# Patient Record
Sex: Male | Born: 2000 | Hispanic: No | Marital: Single | Smoking: Never smoker
Health system: Southern US, Community
[De-identification: ages and names within clinical notes are randomized; demographics above are authoritative.]

---
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
## 2020-01-02 DIAGNOSIS — Z419 Encounter for procedure for purposes other than remedying health state, unspecified: Secondary | ICD-10-CM | POA: Diagnosis not present

## 2020-02-01 DIAGNOSIS — Z419 Encounter for procedure for purposes other than remedying health state, unspecified: Secondary | ICD-10-CM | POA: Diagnosis not present

## 2020-03-03 DIAGNOSIS — Z419 Encounter for procedure for purposes other than remedying health state, unspecified: Secondary | ICD-10-CM | POA: Diagnosis not present

## 2020-04-03 DIAGNOSIS — Z419 Encounter for procedure for purposes other than remedying health state, unspecified: Secondary | ICD-10-CM | POA: Diagnosis not present

## 2020-05-01 DIAGNOSIS — Z419 Encounter for procedure for purposes other than remedying health state, unspecified: Secondary | ICD-10-CM | POA: Diagnosis not present

## 2020-06-01 DIAGNOSIS — Z419 Encounter for procedure for purposes other than remedying health state, unspecified: Secondary | ICD-10-CM | POA: Diagnosis not present

## 2020-07-01 DIAGNOSIS — Z419 Encounter for procedure for purposes other than remedying health state, unspecified: Secondary | ICD-10-CM | POA: Diagnosis not present

## 2020-07-18 DIAGNOSIS — Z7689 Persons encountering health services in other specified circumstances: Secondary | ICD-10-CM | POA: Diagnosis not present

## 2020-07-18 DIAGNOSIS — R430 Anosmia: Secondary | ICD-10-CM | POA: Diagnosis not present

## 2020-07-18 DIAGNOSIS — J309 Allergic rhinitis, unspecified: Secondary | ICD-10-CM | POA: Diagnosis not present

## 2020-07-18 DIAGNOSIS — E663 Overweight: Secondary | ICD-10-CM | POA: Diagnosis not present

## 2020-07-20 DIAGNOSIS — E663 Overweight: Secondary | ICD-10-CM | POA: Diagnosis not present

## 2020-08-01 DIAGNOSIS — Z419 Encounter for procedure for purposes other than remedying health state, unspecified: Secondary | ICD-10-CM | POA: Diagnosis not present

## 2020-08-31 DIAGNOSIS — Z419 Encounter for procedure for purposes other than remedying health state, unspecified: Secondary | ICD-10-CM | POA: Diagnosis not present

## 2020-10-01 DIAGNOSIS — Z419 Encounter for procedure for purposes other than remedying health state, unspecified: Secondary | ICD-10-CM | POA: Diagnosis not present

## 2020-11-01 DIAGNOSIS — Z419 Encounter for procedure for purposes other than remedying health state, unspecified: Secondary | ICD-10-CM | POA: Diagnosis not present

## 2020-12-01 DIAGNOSIS — Z419 Encounter for procedure for purposes other than remedying health state, unspecified: Secondary | ICD-10-CM | POA: Diagnosis not present

## 2021-01-01 DIAGNOSIS — Z419 Encounter for procedure for purposes other than remedying health state, unspecified: Secondary | ICD-10-CM | POA: Diagnosis not present

## 2021-01-31 DIAGNOSIS — Z419 Encounter for procedure for purposes other than remedying health state, unspecified: Secondary | ICD-10-CM | POA: Diagnosis not present

## 2021-03-03 DIAGNOSIS — Z419 Encounter for procedure for purposes other than remedying health state, unspecified: Secondary | ICD-10-CM | POA: Diagnosis not present

## 2021-03-05 ENCOUNTER — Ambulatory Visit (INDEPENDENT_AMBULATORY_CARE_PROVIDER_SITE_OTHER): Payer: Medicaid Other

## 2021-03-05 ENCOUNTER — Other Ambulatory Visit: Payer: Self-pay

## 2021-03-05 ENCOUNTER — Encounter (HOSPITAL_COMMUNITY): Payer: Self-pay | Admitting: *Deleted

## 2021-03-05 ENCOUNTER — Ambulatory Visit (HOSPITAL_COMMUNITY)
Admission: EM | Admit: 2021-03-05 | Discharge: 2021-03-05 | Disposition: A | Discharge status: Home / Self Care | Payer: Medicaid Other | Attending: Emergency Medicine | Admitting: Emergency Medicine

## 2021-03-05 DIAGNOSIS — S9781XA Crushing injury of right foot, initial encounter: Secondary | ICD-10-CM

## 2021-03-05 DIAGNOSIS — S9031XA Contusion of right foot, initial encounter: Secondary | ICD-10-CM

## 2021-03-05 MED ORDER — DICLOFENAC SODIUM 1 % EX GEL: 4.0000 g | Freq: Four times a day (QID) | CUTANEOUS | 0 refills | Status: AC

## 2021-03-05 NOTE — Discharge Instructions (Addendum)
-  Voltaren gel up to 4x daily -You can take Tylenol up to 1000 mg 3 times daily, and ibuprofen up to 600 mg 3 times daily with food.  You can take these together, or alternate every 3-4 hours. -Rest, ice -Shoe while pain persists- while walking and standing.

## 2021-03-05 NOTE — ED Provider Notes (Signed)
[facility]    CSN: [id number] Arrival date & time: 03/05/21  1800      History   Chief Complaint Chief Complaint  Patient presents with   Foot Injury   Back Pain    HPI Carlos Franklin is a 21 y.o. male presenting with right foot pain for 1 day following a wall/pallet falling on him that he was helping carry.  States the pallet fell on his foot and caused him to fall backwards onto his bottom.  Pain and bruising to the right toes.  Also with some tenderness over the right buttock.  Also with shoulder pain with movement.  Ambulating with pain.  Tylenol cannot provide relief.  Denies sensation changes.  Denies head trauma, LOC.  HPI  History reviewed. No pertinent past medical history.  Patient Active Problem List   Diagnosis Date Noted   Common wart 06/11/2012    History reviewed. No pertinent surgical history.     Home Medications    Prior to Admission medications   Medication Sig Start Date End Date Taking? Authorizing Provider  diclofenac Sodium (VOLTAREN) 1 % GEL Apply 4 g topically 4 (four) times daily. 03/05/21  Yes Hazel Sams, PA-C  dicyclomine (BENTYL) 20 MG tablet Take 1 tablet (20 mg total) by mouth 3 (three) times daily before meals. As needed for abdominal cramping 04/24/16   Ward, Delice Bison, DO    Family History History reviewed. No pertinent family history.  Social History Social History   Tobacco Use   Smoking status: Never   Smokeless tobacco: Never     Allergies   Patient has no known allergies.   Review of Systems Review of Systems  Musculoskeletal:        R foot pain  All other systems reviewed and are negative.   Physical Exam Triage Vital Signs ED Triage Vitals  Enc Vitals Group     BP 03/05/21 1937 123/71     Pulse --      Resp 03/05/21 1937 20     Temp 03/05/21 1937 (!) 84 F (28.9 C)     Temp src --      SpO2 03/05/21 1937 97 %     Weight --      Height --      Head Circumference --      Peak Flow --       Pain Score 03/05/21 1933 8     Pain Loc --      Pain Edu? --      Excl. in [place]? --    No data found.  Updated Vital Signs BP 123/71    Temp 98.4 F (36.9 C) (Oral)    Resp 20    SpO2 97%   Visual Acuity Right Eye Distance:   Left Eye Distance:   Bilateral Distance:    Right Eye Near:   Left Eye Near:    Bilateral Near:     Physical Exam Vitals reviewed.  Constitutional:      General: He is not in acute distress.    Appearance: Normal appearance. He is not ill-appearing.  HENT:     Head: Normocephalic and atraumatic.  Cardiovascular:     Rate and Rhythm: Normal rate and regular rhythm.     Heart sounds: Normal heart sounds.  Pulmonary:     Effort: Pulmonary effort is normal.     Breath sounds: Normal breath sounds and air entry.  Abdominal:     Tenderness: There is  no abdominal tenderness. There is no right CVA tenderness, left CVA tenderness, guarding or rebound.  Musculoskeletal:     Cervical back: Normal range of motion. No swelling, deformity, signs of trauma, rigidity, spasms, tenderness, bony tenderness or crepitus. No pain with movement.     Thoracic back: No swelling, deformity, signs of trauma, spasms, tenderness or bony tenderness. Normal range of motion. No scoliosis.     Lumbar back: No swelling, deformity, signs of trauma, spasms, tenderness or bony tenderness. Normal range of motion. Negative right straight leg raise test and negative left straight leg raise test. No scoliosis.     Comments: Bilateral proximal trapezius tenderness to palpation. No paraspinous tenderness. No midline spinous tenderness, deformity, stepoff.  Right foot-dorsal toes 2-4 with ecchymosis and tenderness to palpation.  No bony deformity.  No midfoot or malleolar tenderness.  Ambulating with pain.  DP 2+, cap refill less than 2 seconds.  Some tenderness to palpation right buttock without skin changes or bony tenderness.  Range of motion hip intact and without pain.  No hip or pelvic  instability  Absolutely no other injury, deformity, tenderness, ecchymosis, abrasion.  Neurological:     General: No focal deficit present.     Mental Status: He is alert.     Cranial Nerves: No cranial nerve deficit.  Psychiatric:        Mood and Affect: Mood normal.        Behavior: Behavior normal.        Thought Content: Thought content normal.        Judgment: Judgment normal.     UC Treatments / Results  Labs (all labs ordered are listed, but only abnormal results are displayed) Labs Reviewed - No data to display  EKG   Radiology DG Foot Complete Right  Result Date: 03/05/2021 CLINICAL DATA:  Crush injury third through fifth digits EXAM: RIGHT FOOT COMPLETE - 3+ VIEW COMPARISON:  None. FINDINGS: Frontal, oblique, and lateral views of the right foot are obtained. No acute fracture, subluxation, or dislocation. Joint spaces are well preserved. Soft tissues are normal. IMPRESSION: 1. Unremarkable right foot. Electronically Signed   By: Sharlet Salina M.D.   On: 03/05/2021 19:49    Procedures Procedures (including critical care time)  Medications Ordered in UC Medications - No data to display  Initial Impression / Assessment and Plan / UC Course  I have reviewed the triage vital signs and the nursing notes.  Pertinent labs & imaging results that were available during my care of the patient were reviewed by me and considered in my medical decision making (see chart for details).     This patient is a very pleasant 21 y.o. year old male presenting with R foot contusion.  Neurovascularly intact.   Xray R foot - negative.   Postop shoe.  Patient wishes to try Voltaren gel for pain relief.  Sent.  Also rec Tylenol/ibuprofen, RICE.  ED return precautions discussed. Patient verbalizes understanding and agreement.     Final Clinical Impressions(s) / UC Diagnoses   Final diagnoses:  Contusion of right foot, initial encounter     Discharge Instructions       -Voltaren gel up to 4x daily -You can take Tylenol up to 1000 mg 3 times daily, and ibuprofen up to 600 mg 3 times daily with food.  You can take these together, or alternate every 3-4 hours. -Rest, ice -Shoe while pain persists- while walking and standing.     ED Prescriptions  Medication Sig Dispense Auth. Provider   diclofenac Sodium (VOLTAREN) 1 % GEL Apply 4 g topically 4 (four) times daily. 100 g Hazel Sams, PA-C      PDMP not reviewed this encounter.   Hazel Sams, PA-C 03/05/21 2025

## 2021-03-05 NOTE — ED Triage Notes (Signed)
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

## 2021-04-03 DIAGNOSIS — Z419 Encounter for procedure for purposes other than remedying health state, unspecified: Secondary | ICD-10-CM | POA: Diagnosis not present

## 2021-05-01 DIAGNOSIS — Z419 Encounter for procedure for purposes other than remedying health state, unspecified: Secondary | ICD-10-CM | POA: Diagnosis not present

## 2021-06-01 DIAGNOSIS — Z419 Encounter for procedure for purposes other than remedying health state, unspecified: Secondary | ICD-10-CM | POA: Diagnosis not present

## 2021-07-01 DIAGNOSIS — Z419 Encounter for procedure for purposes other than remedying health state, unspecified: Secondary | ICD-10-CM | POA: Diagnosis not present

## 2021-08-01 DIAGNOSIS — Z419 Encounter for procedure for purposes other than remedying health state, unspecified: Secondary | ICD-10-CM | POA: Diagnosis not present

## 2021-08-31 DIAGNOSIS — Z419 Encounter for procedure for purposes other than remedying health state, unspecified: Secondary | ICD-10-CM | POA: Diagnosis not present

## 2021-10-01 DIAGNOSIS — Z419 Encounter for procedure for purposes other than remedying health state, unspecified: Secondary | ICD-10-CM | POA: Diagnosis not present

## 2021-11-01 DIAGNOSIS — Z419 Encounter for procedure for purposes other than remedying health state, unspecified: Secondary | ICD-10-CM | POA: Diagnosis not present

## 2021-12-10 ENCOUNTER — Emergency Department (HOSPITAL_COMMUNITY)
Admission: EM | Admit: 2021-12-10 | Discharge: 2021-12-10 | Disposition: A | Discharge status: Home / Self Care | Payer: Medicaid Other | Attending: Emergency Medicine | Admitting: Emergency Medicine

## 2021-12-10 ENCOUNTER — Encounter (HOSPITAL_COMMUNITY): Payer: Self-pay

## 2021-12-10 DIAGNOSIS — H73892 Other specified disorders of tympanic membrane, left ear: Secondary | ICD-10-CM | POA: Insufficient documentation

## 2021-12-10 DIAGNOSIS — H66001 Acute suppurative otitis media without spontaneous rupture of ear drum, right ear: Secondary | ICD-10-CM | POA: Insufficient documentation

## 2021-12-10 MED ORDER — IBUPROFEN 800 MG PO TABS: 800.0000 mg | ORAL_TABLET | Freq: Three times a day (TID) | ORAL | 0 refills | Status: AC

## 2021-12-10 MED ORDER — AMOXICILLIN 500 MG PO CAPS: 500.0000 mg | ORAL_CAPSULE | Freq: Three times a day (TID) | ORAL | 0 refills | Status: AC

## 2021-12-10 NOTE — Discharge Instructions (Signed)
You have a right-sided ear infection.  Your left ear does not appear infected however the antibiotic will cover it as well.  Ibuprofen has been sent for your pain and you may alternate this with Tylenol as well.  Please follow-up with the PCP in the future.  A couple of offices are attached to this discharge.

## 2021-12-10 NOTE — ED Triage Notes (Signed)
Pt arrived via POV, c/o right ear pain x3 wks, left ear with pain this morning. Has tried some OTC treatment with little relief.

## 2021-12-10 NOTE — ED Provider Notes (Signed)
[facility] DEPT Provider Note   CSN: [id number] Arrival date & time: 12/10/21  1421     History  Chief Complaint  Patient presents with   Otalgia    Carlos Franklin is a 21 y.o. male presenting with bilateral otalgia.  Has been going on for "a while" but this over the past 3 days it is acutely worsened.  No fevers or chills.  No decrease in hearing.  No drainage.   Otalgia Associated symptoms: no ear discharge and no hearing loss        Home Medications Prior to Admission medications   Medication Sig Start Date End Date Taking? Authorizing Provider  amoxicillin (AMOXIL) 500 MG capsule Take 1 capsule (500 mg total) by mouth 3 (three) times daily. 12/10/21  Yes Kenric Ginger A, PA-C  ibuprofen (ADVIL) 800 MG tablet Take 1 tablet (800 mg total) by mouth 3 (three) times daily. 12/10/21  Yes Harjot Dibello A, PA-C  diclofenac Sodium (VOLTAREN) 1 % GEL Apply 4 g topically 4 (four) times daily. 03/05/21   Hazel Sams, PA-C  dicyclomine (BENTYL) 20 MG tablet Take 1 tablet (20 mg total) by mouth 3 (three) times daily before meals. As needed for abdominal cramping 04/24/16   Ward, Delice Bison, DO      Allergies    Patient has no known allergies.    Review of Systems   Review of Systems  Constitutional:  Negative for chills.  HENT:  Positive for ear pain. Negative for ear discharge, facial swelling, hearing loss and sinus pain.     Physical Exam Updated Vital Signs BP 130/88 (BP Location: Left Arm)   Pulse 86   Temp 98.1 F (36.7 C) (Oral)   Resp 18   SpO2 100%  Physical Exam Vitals and nursing note reviewed.  Constitutional:      Appearance: Normal appearance.  HENT:     Head: Normocephalic and atraumatic.     Left Ear: Tympanic membrane and ear canal normal.     Ears:     Comments: Erythematous left ear canal without inflammation.  Right ear canal erythematous, inflamed and with a bulging TM. Eyes:     General: No scleral  icterus.    Conjunctiva/sclera: Conjunctivae normal.  Pulmonary:     Effort: Pulmonary effort is normal. No respiratory distress.  Skin:    Findings: No rash.  Neurological:     Mental Status: He is alert.  Psychiatric:        Mood and Affect: Mood normal.     ED Results / Procedures / Treatments   Labs (all labs ordered are listed, but only abnormal results are displayed) Labs Reviewed - No data to display  EKG None  Radiology No results found.  Procedures Procedures   Medications Ordered in ED Medications - No data to display  ED Course/ Medical Decision Making/ A&P                           Medical Decision Making Risk Prescription drug management.   21 year old male presenting with ear pain.  Physical exam consistent with otitis media on the right side, relatively benign left ear evaluation.  No suspicion for mastoiditis or labyrinthitis.  Because patient has been dealing with this for an extended period of time I will treat him with amoxicillin.  He needs a primary care provider who he can see for such complaints so I have listed some offices on his  discharge papers.  He is agreeable to discharge with these medications.Work note provided.  Final Clinical Impression(s) / ED Diagnoses Final diagnoses:  Non-recurrent acute suppurative otitis media of right ear without spontaneous rupture of tympanic membrane    Rx / DC Orders ED Discharge Orders          Ordered    amoxicillin (AMOXIL) 500 MG capsule  3 times daily        12/10/21 1442    ibuprofen (ADVIL) 800 MG tablet  3 times daily        12/10/21 1442           Results and diagnoses were explained to the patient. Return precautions discussed in full. Patient had no additional questions and expressed complete understanding.   This chart was dictated using voice recognition software.  Despite best efforts to proofread,  errors can occur which can change the documentation meaning.    Saddie Benders, PA-C 12/10/21 1446    Lonell Grandchild, MD 12/12/21 [phone]

## 2022-01-31 DIAGNOSIS — Z419 Encounter for procedure for purposes other than remedying health state, unspecified: Secondary | ICD-10-CM | POA: Diagnosis not present

## 2022-04-03 DIAGNOSIS — Z419 Encounter for procedure for purposes other than remedying health state, unspecified: Secondary | ICD-10-CM | POA: Diagnosis not present

## 2022-05-02 DIAGNOSIS — Z419 Encounter for procedure for purposes other than remedying health state, unspecified: Secondary | ICD-10-CM | POA: Diagnosis not present

## 2022-06-02 DIAGNOSIS — Z419 Encounter for procedure for purposes other than remedying health state, unspecified: Secondary | ICD-10-CM | POA: Diagnosis not present

## 2022-07-02 DIAGNOSIS — Z419 Encounter for procedure for purposes other than remedying health state, unspecified: Secondary | ICD-10-CM | POA: Diagnosis not present

## 2022-08-02 DIAGNOSIS — Z419 Encounter for procedure for purposes other than remedying health state, unspecified: Secondary | ICD-10-CM | POA: Diagnosis not present

## 2022-09-01 DIAGNOSIS — Z419 Encounter for procedure for purposes other than remedying health state, unspecified: Secondary | ICD-10-CM | POA: Diagnosis not present

## 2022-10-02 DIAGNOSIS — Z419 Encounter for procedure for purposes other than remedying health state, unspecified: Secondary | ICD-10-CM | POA: Diagnosis not present

## 2022-11-02 DIAGNOSIS — Z419 Encounter for procedure for purposes other than remedying health state, unspecified: Secondary | ICD-10-CM | POA: Diagnosis not present

## 2023-01-02 DIAGNOSIS — Z419 Encounter for procedure for purposes other than remedying health state, unspecified: Secondary | ICD-10-CM | POA: Diagnosis not present

## 2023-02-01 DIAGNOSIS — Z419 Encounter for procedure for purposes other than remedying health state, unspecified: Secondary | ICD-10-CM | POA: Diagnosis not present

## 2023-03-04 DIAGNOSIS — Z419 Encounter for procedure for purposes other than remedying health state, unspecified: Secondary | ICD-10-CM | POA: Diagnosis not present

## 2023-04-04 DIAGNOSIS — Z419 Encounter for procedure for purposes other than remedying health state, unspecified: Secondary | ICD-10-CM | POA: Diagnosis not present

## 2023-05-02 DIAGNOSIS — Z419 Encounter for procedure for purposes other than remedying health state, unspecified: Secondary | ICD-10-CM | POA: Diagnosis not present

## 2023-06-13 DIAGNOSIS — Z419 Encounter for procedure for purposes other than remedying health state, unspecified: Secondary | ICD-10-CM | POA: Diagnosis not present

## 2023-07-13 DIAGNOSIS — Z419 Encounter for procedure for purposes other than remedying health state, unspecified: Secondary | ICD-10-CM | POA: Diagnosis not present

## 2023-07-28 IMAGING — DX DG FOOT COMPLETE 3+V*R*
4 series · 4 of 4 positions shown · non-contrast
Comparison: None.

CLINICAL DATA: Crush injury third through fifth digits

EXAM:
RIGHT FOOT COMPLETE - 3+ VIEW

[foot ap]
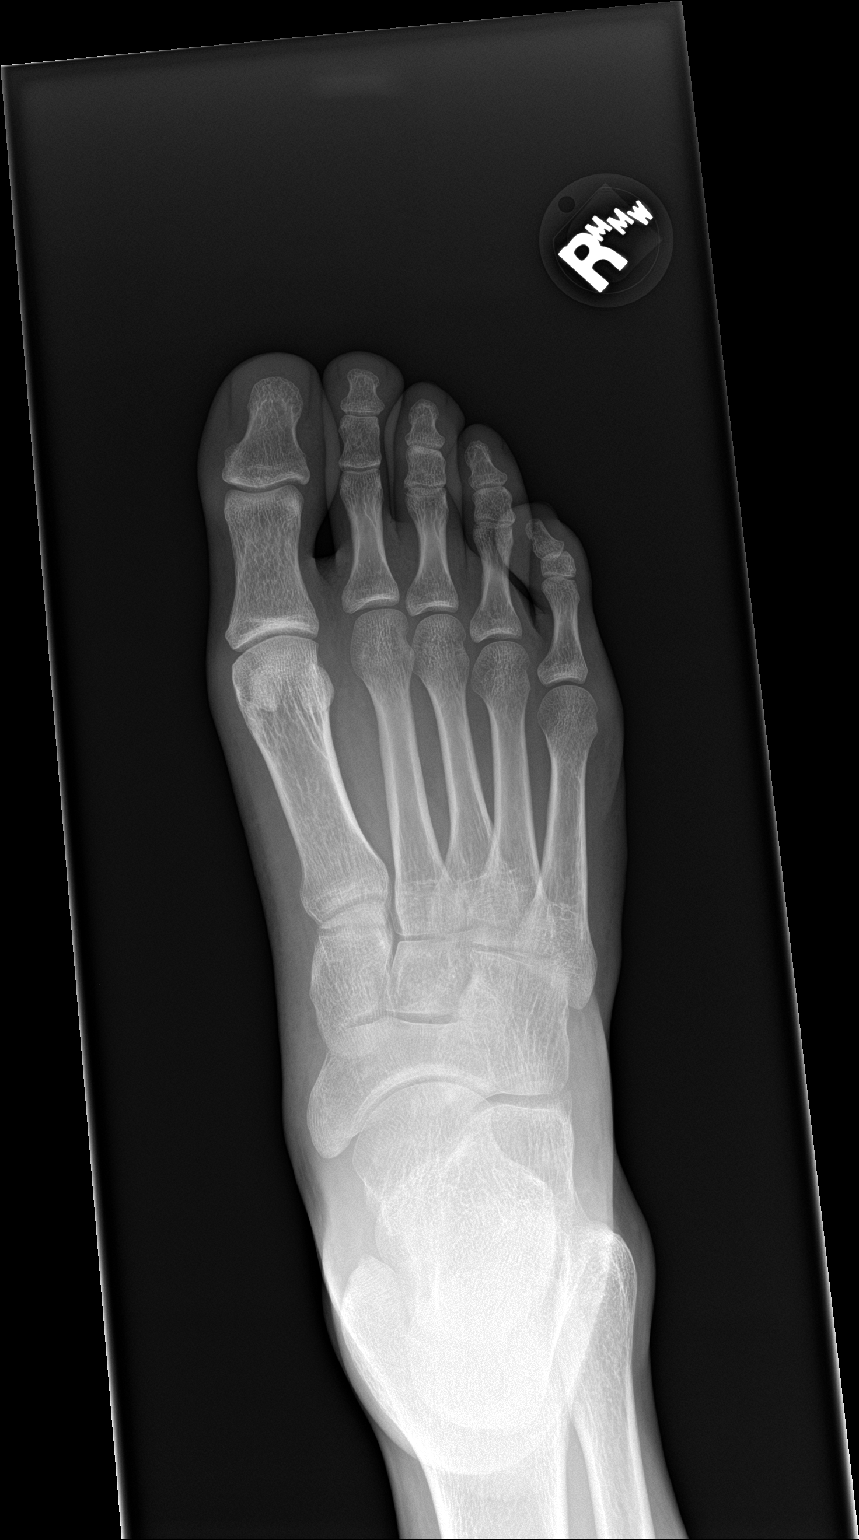

[foot obl]
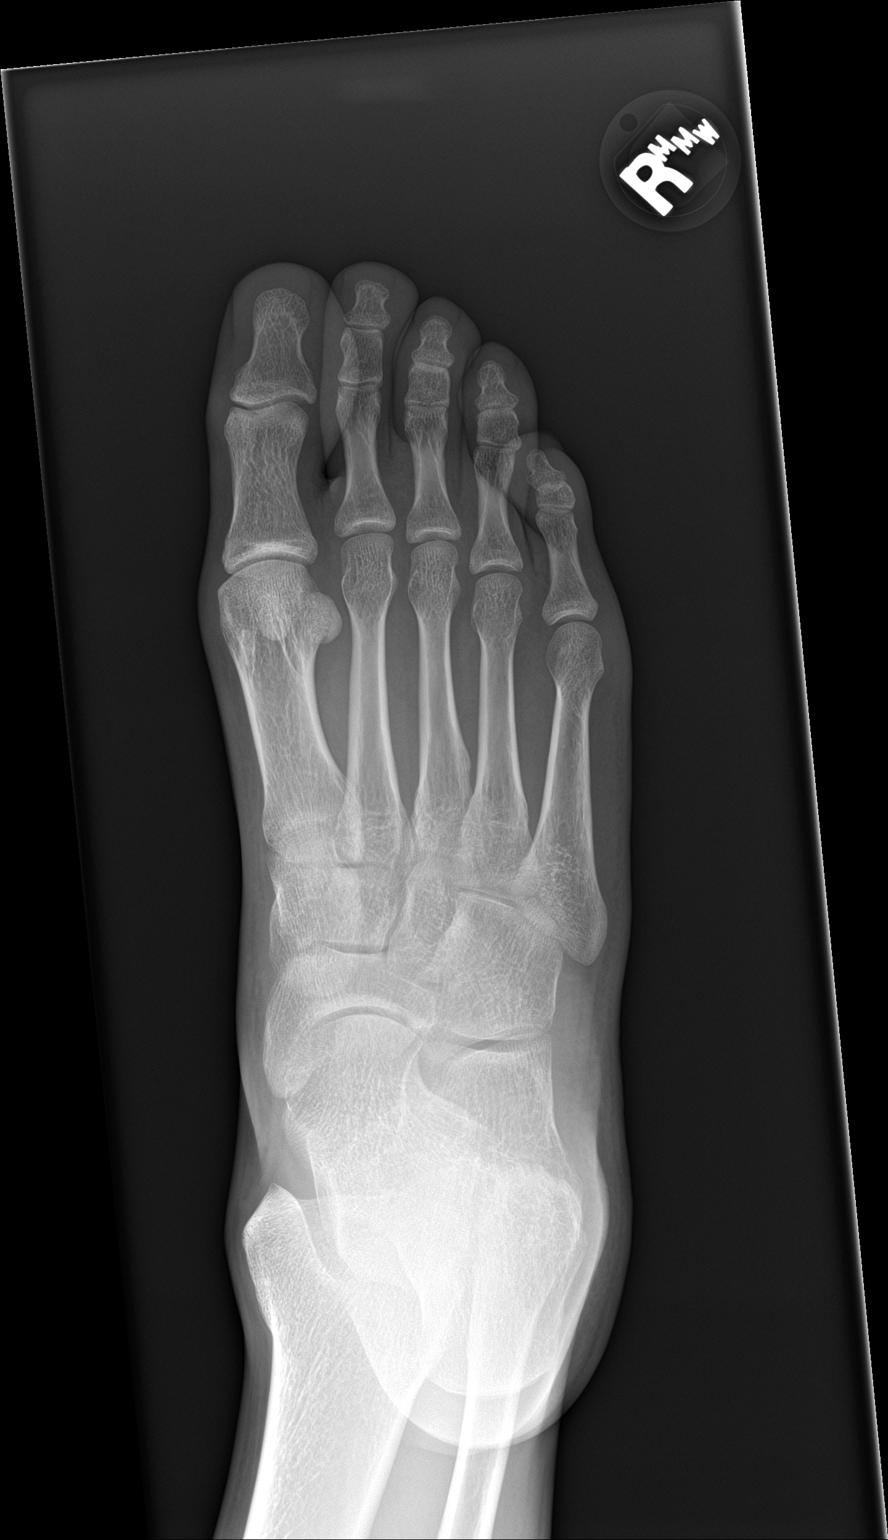

[foot lat (1 of 2)]
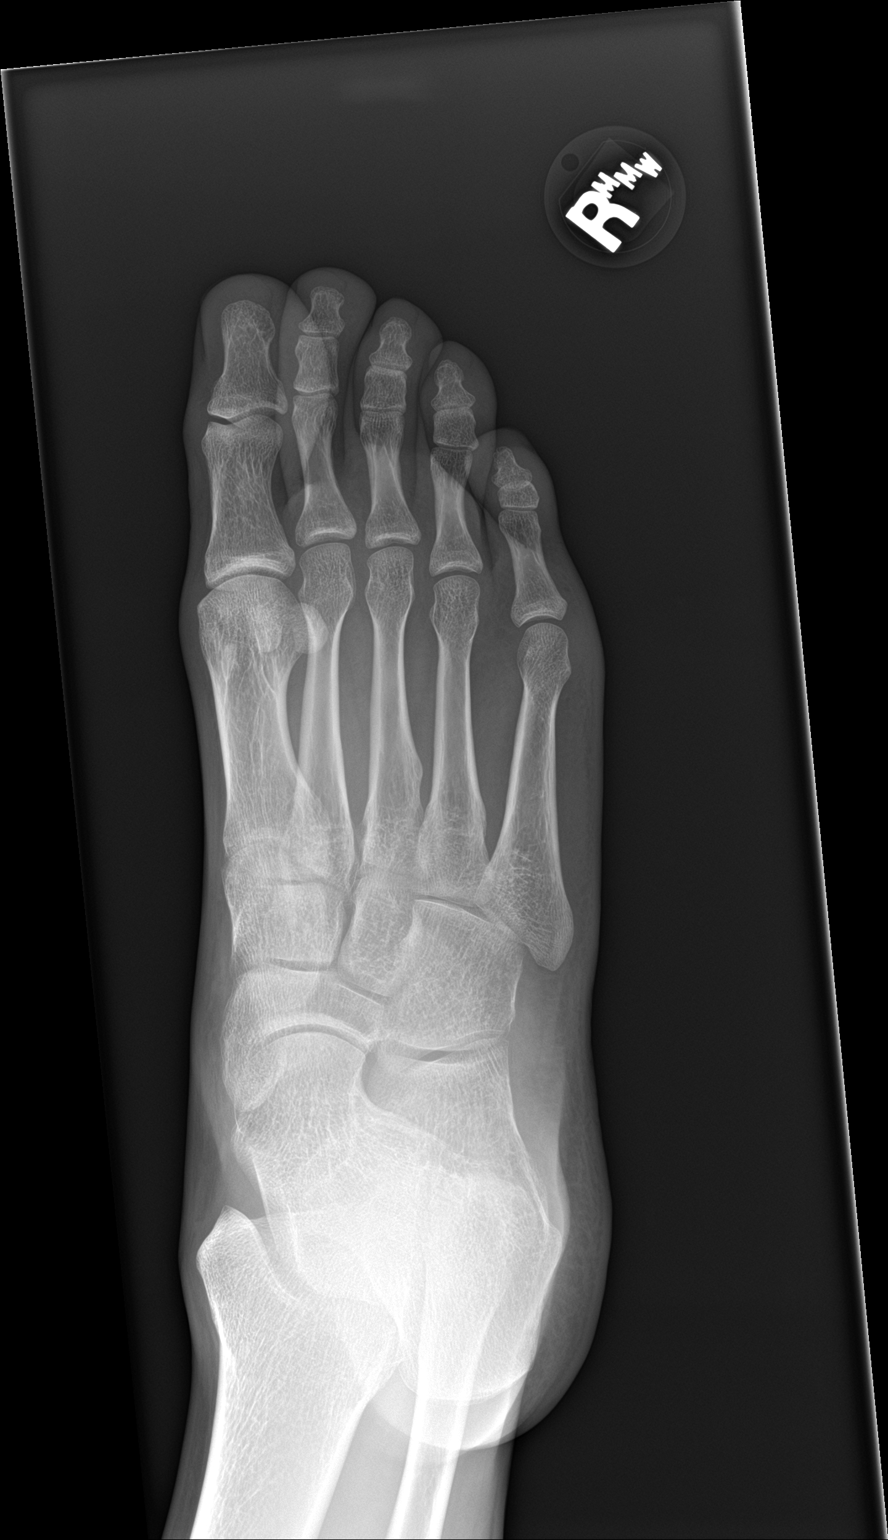

[foot lat (2 of 2)]
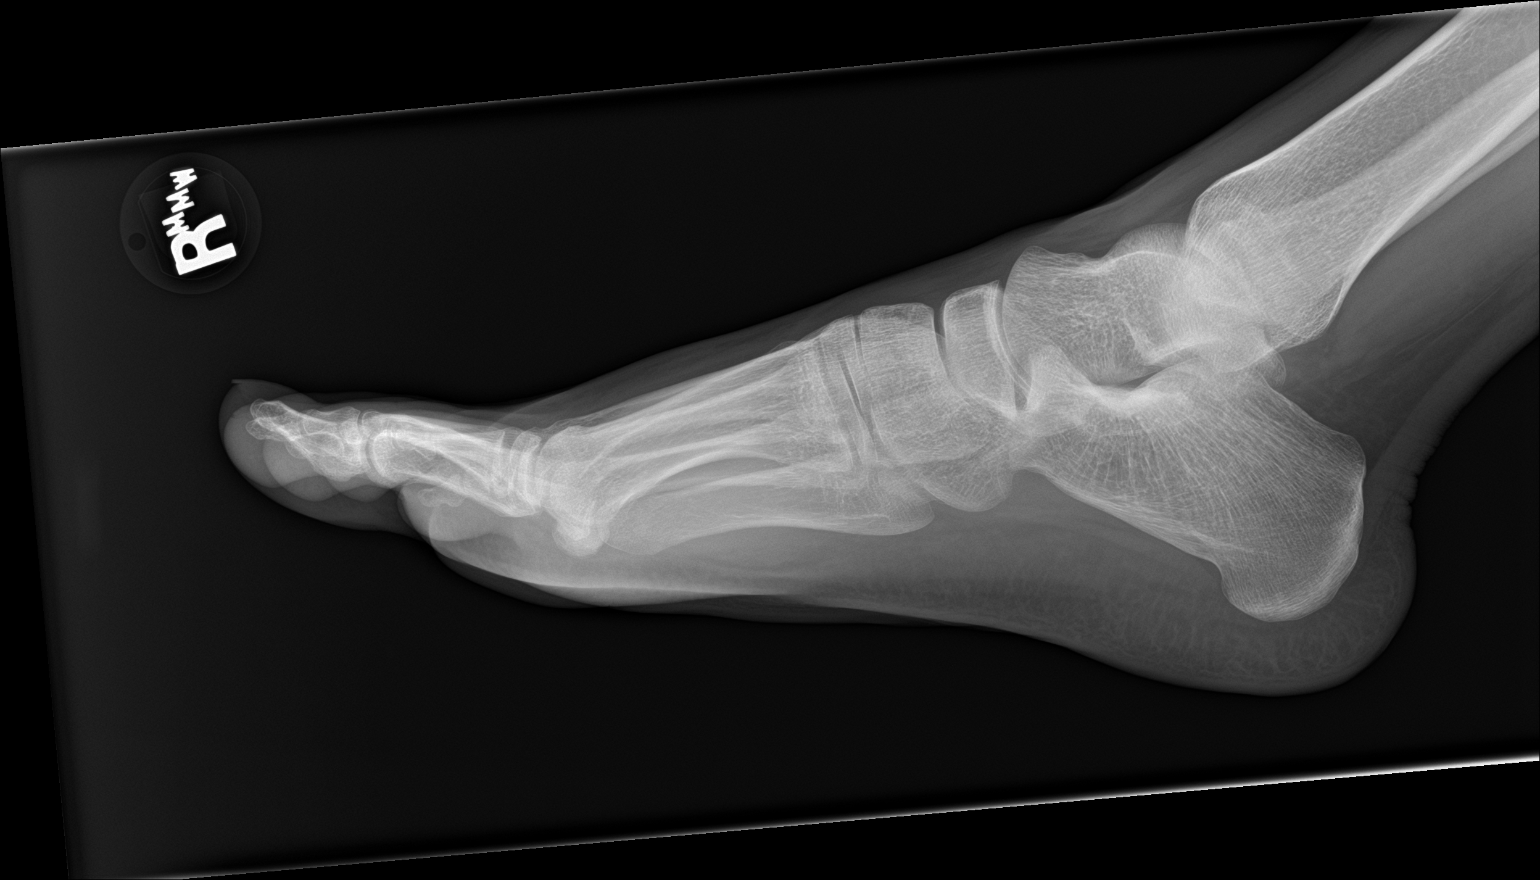

[4 of 4 positions shown; findings below may reference images not displayed]

FINDINGS: Frontal, oblique, and lateral views of the right foot are obtained.
No acute fracture, subluxation, or dislocation. Joint spaces are
well preserved. Soft tissues are normal.
IMPRESSION: 1. Unremarkable right foot.

## 2023-08-13 DIAGNOSIS — Z419 Encounter for procedure for purposes other than remedying health state, unspecified: Secondary | ICD-10-CM | POA: Diagnosis not present

## 2023-09-12 DIAGNOSIS — Z419 Encounter for procedure for purposes other than remedying health state, unspecified: Secondary | ICD-10-CM | POA: Diagnosis not present

## 2023-10-13 DIAGNOSIS — Z419 Encounter for procedure for purposes other than remedying health state, unspecified: Secondary | ICD-10-CM | POA: Diagnosis not present

## 2023-11-13 DIAGNOSIS — Z419 Encounter for procedure for purposes other than remedying health state, unspecified: Secondary | ICD-10-CM | POA: Diagnosis not present
